# Patient Record
Sex: Female | Born: 1965 | Race: Black or African American | Hispanic: No | Marital: Married
Health system: Southern US, Academic
[De-identification: ages and names within clinical notes are randomized; demographics above are authoritative.]

## PROBLEM LIST (undated history)

## (undated) ENCOUNTER — Ambulatory Visit: Attending: Gastroenterology | Primary: Gastroenterology

## (undated) ENCOUNTER — Ambulatory Visit

## (undated) ENCOUNTER — Telehealth

## (undated) ENCOUNTER — Ambulatory Visit
Attending: Pharmacist Clinician (PhC)/ Clinical Pharmacy Specialist | Primary: Pharmacist Clinician (PhC)/ Clinical Pharmacy Specialist

## (undated) DIAGNOSIS — F4310 Post-traumatic stress disorder, unspecified: Secondary | ICD-10-CM

## (undated) DIAGNOSIS — I252 Old myocardial infarction: Secondary | ICD-10-CM

## (undated) DIAGNOSIS — I10 Essential (primary) hypertension: Secondary | ICD-10-CM

## (undated) DIAGNOSIS — E119 Type 2 diabetes mellitus without complications: Secondary | ICD-10-CM

## (undated) DIAGNOSIS — F32A Depression, unspecified: Secondary | ICD-10-CM

## (undated) HISTORY — PX: OTHER SURGICAL HISTORY: SHX169

## (undated) HISTORY — PX: CARPAL TUNNEL RELEASE: SHX101

---
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
## 2011-06-19 ENCOUNTER — Emergency Department (HOSPITAL_COMMUNITY)
Admission: EM | Admit: 2011-06-19 | Discharge: 2011-06-19 | Disposition: A | Discharge status: Home / Self Care | Payer: Self-pay | Attending: Emergency Medicine | Admitting: Emergency Medicine

## 2011-06-19 DIAGNOSIS — F329 Major depressive disorder, single episode, unspecified: Secondary | ICD-10-CM | POA: Insufficient documentation

## 2011-06-19 DIAGNOSIS — R05 Cough: Secondary | ICD-10-CM | POA: Insufficient documentation

## 2011-06-19 DIAGNOSIS — J398 Other specified diseases of upper respiratory tract: Secondary | ICD-10-CM | POA: Insufficient documentation

## 2011-06-19 DIAGNOSIS — J069 Acute upper respiratory infection, unspecified: Secondary | ICD-10-CM | POA: Insufficient documentation

## 2011-06-19 DIAGNOSIS — F3289 Other specified depressive episodes: Secondary | ICD-10-CM | POA: Insufficient documentation

## 2011-06-19 DIAGNOSIS — J40 Bronchitis, not specified as acute or chronic: Secondary | ICD-10-CM | POA: Insufficient documentation

## 2011-06-19 DIAGNOSIS — E669 Obesity, unspecified: Secondary | ICD-10-CM | POA: Insufficient documentation

## 2011-06-19 DIAGNOSIS — R0982 Postnasal drip: Secondary | ICD-10-CM | POA: Insufficient documentation

## 2011-06-19 DIAGNOSIS — R059 Cough, unspecified: Secondary | ICD-10-CM | POA: Insufficient documentation

## 2011-12-01 ENCOUNTER — Other Ambulatory Visit: Payer: Self-pay

## 2011-12-01 ENCOUNTER — Emergency Department (HOSPITAL_COMMUNITY): Payer: Self-pay

## 2011-12-01 ENCOUNTER — Emergency Department (HOSPITAL_COMMUNITY)
Admission: EM | Admit: 2011-12-01 | Discharge: 2011-12-01 | Disposition: A | Discharge status: Home / Self Care | Payer: Self-pay | Attending: Emergency Medicine | Admitting: Emergency Medicine

## 2011-12-01 ENCOUNTER — Encounter (HOSPITAL_COMMUNITY): Payer: Self-pay

## 2011-12-01 DIAGNOSIS — R05 Cough: Secondary | ICD-10-CM | POA: Insufficient documentation

## 2011-12-01 DIAGNOSIS — R5383 Other fatigue: Secondary | ICD-10-CM | POA: Insufficient documentation

## 2011-12-01 DIAGNOSIS — R0602 Shortness of breath: Secondary | ICD-10-CM | POA: Insufficient documentation

## 2011-12-01 DIAGNOSIS — R059 Cough, unspecified: Secondary | ICD-10-CM | POA: Insufficient documentation

## 2011-12-01 DIAGNOSIS — IMO0001 Reserved for inherently not codable concepts without codable children: Secondary | ICD-10-CM | POA: Insufficient documentation

## 2011-12-01 DIAGNOSIS — R5381 Other malaise: Secondary | ICD-10-CM | POA: Insufficient documentation

## 2011-12-01 DIAGNOSIS — M7918 Myalgia, other site: Secondary | ICD-10-CM

## 2011-12-01 DIAGNOSIS — I10 Essential (primary) hypertension: Secondary | ICD-10-CM | POA: Insufficient documentation

## 2011-12-01 DIAGNOSIS — R079 Chest pain, unspecified: Secondary | ICD-10-CM | POA: Insufficient documentation

## 2011-12-01 HISTORY — DX: Essential (primary) hypertension: I10

## 2011-12-01 HISTORY — DX: Old myocardial infarction: I25.2

## 2011-12-01 LAB — CBC
HCT: 42.8 % (ref 36.0–46.0)
MCV: 89.2 fL (ref 78.0–100.0)
RDW: 13.4 % (ref 11.5–15.5)
WBC: 10.9 10*3/uL — ABNORMAL HIGH (ref 4.0–10.5)

## 2011-12-01 LAB — BASIC METABOLIC PANEL
BUN: 9 mg/dL (ref 6–23)
CO2: 20 mEq/L (ref 19–32)
Chloride: 105 mEq/L (ref 96–112)
Creatinine, Ser: 0.53 mg/dL (ref 0.50–1.10)
GFR calc Af Amer: >90 mL/min (ref >90)

## 2011-12-01 MED ORDER — IBUPROFEN 200 MG PO TABS
600.0000 mg | ORAL_TABLET | Freq: Once | ORAL | Status: AC
  Administered 2011-12-01: 600 mg via ORAL
  Filled 2011-12-01: qty 3

## 2011-12-01 MED ORDER — ONDANSETRON HCL 4 MG/2ML IJ SOLN
4.0000 mg | Freq: Once | INTRAMUSCULAR | Status: AC
  Administered 2011-12-01: 4 mg via INTRAVENOUS
  Filled 2011-12-01: qty 2

## 2011-12-01 MED ORDER — FENTANYL CITRATE 0.05 MG/ML IJ SOLN
50.0000 ug | Freq: Once | INTRAMUSCULAR | Status: AC
  Administered 2011-12-01: 50 ug via INTRAVENOUS
  Filled 2011-12-01: qty 2

## 2011-12-01 NOTE — ED Notes (Signed)
Pt to xray

## 2011-12-01 NOTE — ED Notes (Signed)
Waiting for Md to reassess, lab results.
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
## 2011-12-01 NOTE — Discharge Instructions (Signed)
[name] the labs and x-ray today show that you are not having a heart attack today in the ER.  The pain is musculoskeletal in nature.  Use ibuprofen 600mg  every 6 hours x 24 with food and an ice pack to the area.  Get a pcp from the list below to follow up with.  Return for severe SOB, chest pain, or other concerns.   RESOURCE GUIDE  Dental Problems  Patients with Medicaid: [facility] [phone] [address]                                           [phone] [address] Phone:  [phone]                                                  Phone:  [phone]  If unable to pay or uninsured, contact:  [facility] or [facility]. to become qualified for the adult dental clinic.  Chronic Pain Problems Contact [facility]  [phone] Patients need to be referred by their primary care doctor.  Insufficient Money for Medicine Contact United Way:  call "211" or [facility] 519-024-4054.  No Primary Care Doctor Call [facility]  [phone] Other agencies that provide inexpensive medical care    [facility]  [phone]    [facility]  [phone]    [facility]  [phone]    [facility]  [phone]    [facility]  [phone]    [facility]  [phone]  Psychological [facility]  [phone] [facility]  [phone] [facility]   [phone] (emergency [facility] [phone])  Substance Abuse Resources [facility]  2620512091 [facility] [phone] [facility] [place] [phone] [facility] [phone] Residential & Outpatient Substance Abuse Program  782-407-8483  Abuse/Neglect [facility] Child Abuse Hotline 938-493-6454 [facility] Child Abuse Hotline (856) 560-2652 (After Hours)  Emergency Shelter [facility] (385)483-4495  Maternity Homes Room  at the [place] [facility] [phone] [facility] [phone]) [phone]  MRSA Hotline #:   (972)818-8510    [facility] Resources  [facility]     [employer]                          [facility]. [address] [place]                       [address]      [address] [place] [address]  Anna [place]  [facility] Phone:  [phone]                                   Phone:  [phone]                 Phone:  [phone]  [facility] Mental Health Phone:  [phone]  [facility] Child Abuse Hotline (774) 736-5690 (873)756-8622 (After Hours) Emotional Crisis Part of your problem today may be due to an emotional crisis. Emotional states can cause many different physical signs and symptoms. These may include:  Chest or stomach pain.   Fluttering heartbeat.   Passing out.   Breathing difficulty.   Headaches.   Trembling.   Hot or cold flashes.   Numbness.   Dizziness.   Unusual muscle pain or fatigue.   Insomnia.  When you have other medical problems, they are often made worse by emotional upsets. Emotional crises can increase your stress and anxiety. Finding ways to reduce your stress level can make you feel better. You will become more capable of dealing with these emotional states. Regular physical exercise such as walking can be very beneficial. Counseling or medicine to treat anxiety or depression may also be needed. See your caregiver if you have further problems or questions about your condition. Document Released: 09/02/2005 Document Revised: 08/22/2011 Document Reviewed: 02/17/2007 [facility] Patient Information 2012 [place], [place].Emotional Crisis Part of your problem today may be due to an emotional crisis. Emotional states can cause many different physical signs and symptoms. These may include:  Chest or stomach pain.   Fluttering  heartbeat.   Passing out.   Breathing difficulty.   Headaches.   Trembling.   Hot or cold flashes.   Numbness.   Dizziness.   Unusual muscle pain or fatigue.   Insomnia.  When you have other medical problems, they are often made worse by emotional upsets. Emotional crises can increase your stress and anxiety. Finding ways to reduce your stress level can make you feel better. You will become more capable of dealing with these emotional states. Regular physical exercise such as walking can be very beneficial. Counseling or medicine to treat anxiety or depression may also be needed. See your caregiver if you have further problems or questions about your condition. Document Released: 09/02/2005 Document Revised: 08/22/2011 Document Reviewed: 02/17/2007 [facility] Patient Information 2012 [place], [place].

## 2011-12-01 NOTE — ED Notes (Signed)
Pt sts she is ready to go home. md made aware.
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
## 2011-12-02 NOTE — ED Provider Notes (Signed)
Medical screening examination/treatment/procedure(s) were performed by non-physician practitioner and as supervising physician I was immediately available for consultation/collaboration.   Joya Gaskins, MD 12/02/11 [phone]

## 2015-08-21 ENCOUNTER — Emergency Department (HOSPITAL_BASED_OUTPATIENT_CLINIC_OR_DEPARTMENT_OTHER)
Admission: EM | Admit: 2015-08-21 | Discharge: 2015-08-21 | Disposition: A | Discharge status: Home / Self Care | Payer: Self-pay | Attending: Emergency Medicine | Admitting: Emergency Medicine

## 2015-08-21 ENCOUNTER — Encounter (HOSPITAL_BASED_OUTPATIENT_CLINIC_OR_DEPARTMENT_OTHER): Payer: Self-pay | Admitting: *Deleted

## 2015-08-21 ENCOUNTER — Emergency Department (HOSPITAL_BASED_OUTPATIENT_CLINIC_OR_DEPARTMENT_OTHER): Payer: Self-pay

## 2015-08-21 DIAGNOSIS — M791 Myalgia: Secondary | ICD-10-CM | POA: Insufficient documentation

## 2015-08-21 DIAGNOSIS — Z79899 Other long term (current) drug therapy: Secondary | ICD-10-CM | POA: Insufficient documentation

## 2015-08-21 DIAGNOSIS — I10 Essential (primary) hypertension: Secondary | ICD-10-CM | POA: Insufficient documentation

## 2015-08-21 DIAGNOSIS — H748X3 Other specified disorders of middle ear and mastoid, bilateral: Secondary | ICD-10-CM | POA: Insufficient documentation

## 2015-08-21 DIAGNOSIS — I252 Old myocardial infarction: Secondary | ICD-10-CM | POA: Insufficient documentation

## 2015-08-21 DIAGNOSIS — J0140 Acute pansinusitis, unspecified: Secondary | ICD-10-CM

## 2015-08-21 DIAGNOSIS — J111 Influenza due to unidentified influenza virus with other respiratory manifestations: Secondary | ICD-10-CM | POA: Insufficient documentation

## 2015-08-21 DIAGNOSIS — F17210 Nicotine dependence, cigarettes, uncomplicated: Secondary | ICD-10-CM | POA: Insufficient documentation

## 2015-08-21 DIAGNOSIS — Z7982 Long term (current) use of aspirin: Secondary | ICD-10-CM | POA: Insufficient documentation

## 2015-08-21 MED ORDER — AZITHROMYCIN 250 MG PO TABS: 250.0000 mg | ORAL_TABLET | Freq: Every day | ORAL | Status: DC

## 2015-08-21 MED ORDER — ALBUTEROL SULFATE HFA 108 (90 BASE) MCG/ACT IN AERS
1.0000 | INHALATION_SPRAY | RESPIRATORY_TRACT | Status: DC | PRN
  Administered 2015-08-21: 1 via RESPIRATORY_TRACT
  Filled 2015-08-21: qty 6.7

## 2015-08-21 NOTE — Discharge Instructions (Signed)
Influenza, Adult Influenza (flu) is an infection in the mouth, nose, and throat (respiratory tract) caused by a virus. The flu can make you feel very ill. Influenza spreads easily from person to person (contagious).  HOME CARE   Only take medicines as told by your doctor.  Use a cool mist humidifier to make breathing easier.  Get plenty of rest until your fever goes away. This usually takes 3 to 4 days.  Drink enough fluids to keep your pee (urine) clear or pale yellow.  Cover your mouth and nose when you cough or sneeze.  Wash your hands well to avoid spreading the flu.  Stay home from work or school until your fever has been gone for at least 1 full day.  Get a flu shot every year. GET HELP RIGHT AWAY IF:   You have trouble breathing or feel short of breath.  Your skin or nails turn blue.  You have severe neck pain or stiffness.  You have a severe headache, facial pain, or earache.  Your fever gets worse or keeps coming back.  You feel sick to your stomach (nauseous), throw up (vomit), or have watery poop (diarrhea).  You have chest pain.  You have a deep cough that gets worse, or you cough up more thick spit (mucus). MAKE SURE YOU:   Understand these instructions.  Will watch your condition.  Will get help right away if you are not doing well or get worse.   This information is not intended to replace advice given to you by your health care provider. Make sure you discuss any questions you have with your health care provider.   Document Released: 06/11/2008 Document Revised: 09/23/2014 Document Reviewed: 12/02/2011 Elsevier Interactive Patient Education 2016 Elsevier Inc.  

## 2015-08-21 NOTE — ED Provider Notes (Addendum)
CSN: [id number]     Arrival date & time 08/21/15  1205 History   First MD Initiated Contact with Patient 08/21/15 1217     Chief Complaint  Patient presents with  . Fever     (Consider location/radiation/quality/duration/timing/severity/associated sxs/prior Treatment) Patient is a 49 y.o. female presenting with URI. The history is provided by the patient.  URI Presenting symptoms: congestion, cough, facial pain, fever, rhinorrhea and sore throat   Severity:  Severe Onset quality:  Gradual Duration:  3 days Timing:  Constant Progression:  Worsening Chronicity:  New Relieved by:  Nothing Worsened by:  Movement (lying down at night) Ineffective treatments:  OTC medications Associated symptoms: headaches, myalgias, sinus pain, sneezing and wheezing   Risk factors: sick contacts   Risk factors: no diabetes mellitus   Risk factors comment:  Smoker   Past Medical History  Diagnosis Date  . MI, old     Pt states "slight one"  . Hypertension    History reviewed. No pertinent past surgical history. No family history on file. Social History  Substance Use Topics  . Smoking status: Current Every Day Smoker -- 0.50 packs/day    Types: Cigarettes  . Smokeless tobacco: None  . Alcohol Use: Yes   OB History    No data available     Review of Systems  Constitutional: Positive for fever.  HENT: Positive for congestion, rhinorrhea, sneezing and sore throat.   Respiratory: Positive for cough and wheezing.   Musculoskeletal: Positive for myalgias.  Neurological: Positive for headaches.  All other systems reviewed and are negative.     Allergies  Review of patient's allergies indicates no known allergies.  Home Medications   Prior to Admission medications   Medication Sig Start Date End Date Taking? Authorizing Provider  aspirin 81 MG chewable tablet Chew 324 mg by mouth once.    Historical Provider, MD  Sertraline HCl (ZOLOFT PO) Take 1 tablet by mouth daily.     Historical Provider, MD   BP 152/108 mmHg  Pulse 92  Temp(Src) 98 F (36.7 C) (Oral)  Resp 20  Ht 5' (1.524 m)  Wt 175 lb (79.379 kg)  BMI 34.18 kg/m2  SpO2 98% Physical Exam  Constitutional: She is oriented to person, place, and time. She appears well-developed and well-nourished. No distress.  HENT:  Head: Normocephalic and atraumatic.  Right Ear: Tympanic membrane is not erythematous. A middle ear effusion is present.  Left Ear: Tympanic membrane is not erythematous. A middle ear effusion is present.  Nose: Mucosal edema and rhinorrhea present. Right sinus exhibits maxillary sinus tenderness and frontal sinus tenderness. Left sinus exhibits maxillary sinus tenderness and frontal sinus tenderness.  Mouth/Throat: Mucous membranes are normal. Posterior oropharyngeal erythema present. No oropharyngeal exudate, posterior oropharyngeal edema or tonsillar abscesses.  Eyes: Conjunctivae and EOM are normal. Pupils are equal, round, and reactive to light.  Neck: Normal range of motion. Neck supple.  Cardiovascular: Normal rate, regular rhythm and intact distal pulses.   No murmur heard. Pulmonary/Chest: Effort normal and breath sounds normal. No respiratory distress. She has no wheezes. She has no rales.  coughing  Abdominal: Soft. She exhibits no distension. There is no tenderness. There is no rebound and no guarding.  Musculoskeletal: Normal range of motion. She exhibits no edema or tenderness.  Neurological: She is alert and oriented to person, place, and time.  Skin: Skin is warm and dry. No rash noted. No erythema.  Psychiatric: She has a normal mood and affect. Her behavior  is normal.  Nursing note and vitals reviewed.   ED Course  Procedures (including critical care time) Labs Review Labs Reviewed - No data to display  Imaging Review Dg Chest 2 View  08/21/2015  CLINICAL DATA:  Cough, shortness of breath for 3 days EXAM: CHEST  2 VIEW COMPARISON:  05/25/2015 FINDINGS: The  heart size and mediastinal contours are within normal limits. Both lungs are clear. The visualized skeletal structures are unremarkable. IMPRESSION: No active cardiopulmonary disease. Electronically Signed   By: Charlett Nose M.D.   On: 08/21/2015 12:52   I have personally reviewed and evaluated these images and lab results as part of my medical decision-making.   EKG Interpretation None      MDM   Final diagnoses:  Acute pansinusitis, recurrence not specified  Influenza    Pt with symptoms concerning for influenza vs acute sinusitis.  Normal exam here and afebrile.  No signs of breathing difficulty  No signs of strep pharyngitis, otitis or abnormal abdominal findings.   CXR  wnl.   Given sinus sx and smoker with cough will give azithro to treat for atypical infeciton however cautioned if this is the flu then won't help.  Will continue antipyretica and rest and fluids and return for any further problems.     Gwyneth Sprout, MD 08/21/15 1259  Gwyneth Sprout, MD 08/21/15 1301

## 2015-08-21 NOTE — ED Notes (Signed)
Fever, sore throat, body aches, headache, cough and sneezing.

## 2017-04-29 ENCOUNTER — Ambulatory Visit
Admission: RE | Admit: 2017-04-29 | Discharge: 2017-04-29 | Disposition: A | Discharge status: Home / Self Care | Attending: Gastroenterology | Admitting: Gastroenterology

## 2017-04-29 DIAGNOSIS — B1920 Unspecified viral hepatitis C without hepatic coma: Principal | ICD-10-CM

## 2017-05-09 ENCOUNTER — Emergency Department
Admission: EM | Admit: 2017-05-09 | Discharge: 2017-05-09 | Disposition: A | Discharge status: Home / Self Care | Source: Intra-hospital

## 2017-05-09 ENCOUNTER — Emergency Department
Admission: EM | Admit: 2017-05-09 | Discharge: 2017-05-09 | Disposition: A | Discharge status: Home / Self Care | Source: Intra-hospital | Attending: Emergency Medicine | Admitting: Emergency Medicine

## 2017-05-09 DIAGNOSIS — R079 Chest pain, unspecified: Principal | ICD-10-CM

## 2017-05-09 DIAGNOSIS — R202 Paresthesia of skin: Secondary | ICD-10-CM

## 2017-05-09 DIAGNOSIS — R200 Anesthesia of skin: Secondary | ICD-10-CM

## 2017-05-09 MED ORDER — BACLOFEN 10 MG TABLET
ORAL_TABLET | Freq: Three times a day (TID) | ORAL | 0 refills | 0.00000 days | Status: CP
Start: 2017-05-09 — End: 2017-05-16

## 2017-05-09 MED ORDER — TRAMADOL 50 MG TABLET
ORAL_TABLET | Freq: Four times a day (QID) | ORAL | 0 refills | 0.00000 days | Status: CP
Start: 2017-05-09 — End: 2017-05-14

## 2017-05-15 ENCOUNTER — Emergency Department
Admission: EM | Admit: 2017-05-15 | Discharge: 2017-05-16 | Disposition: A | Discharge status: Home / Self Care | Source: Intra-hospital | Admitting: Emergency Medicine

## 2017-05-15 ENCOUNTER — Emergency Department
Admission: EM | Admit: 2017-05-15 | Discharge: 2017-05-16 | Disposition: A | Discharge status: Home / Self Care | Source: Intra-hospital

## 2017-05-16 DIAGNOSIS — R1031 Right lower quadrant pain: Principal | ICD-10-CM

## 2017-05-16 MED ORDER — BACLOFEN 10 MG TABLET
ORAL_TABLET | Freq: Three times a day (TID) | ORAL | 0 refills | 0.00000 days | Status: CP
Start: 2017-05-16 — End: 2017-06-15

## 2017-05-16 MED ORDER — MELOXICAM 15 MG TABLET
ORAL_TABLET | Freq: Every day | ORAL | 0 refills | 0.00000 days | Status: CP
Start: 2017-05-16 — End: 2017-06-15

## 2017-08-12 MED ORDER — LEDIPASVIR 90 MG-SOFOSBUVIR 400 MG TABLET: 1 | tablet | Freq: Every day | 2 refills | 0 days | Status: AC

## 2017-08-12 MED ORDER — LEDIPASVIR 90 MG-SOFOSBUVIR 400 MG TABLET
ORAL_TABLET | Freq: Every day | ORAL | 2 refills | 0.00000 days | Status: CP
Start: 2017-08-12 — End: 2017-08-12

## 2017-09-23 NOTE — Unmapped (Signed)
Patient has been approved to receive Harvoni from manufacturer from 09/19/17 to 12/12/17.

## 2017-09-26 NOTE — Unmapped (Addendum)
Initial Counseling for HCV Treatment     Planned regimen: Harvoni (ledipasvir/sofosbuvir 90/400mg ) x 12 weeks  Planned start date: 09/29/16    Pharmacy: [facility] [phone]    Current medications: metformin, lisinopril, ranitdine 150mg  qam, glipizide, cymbalta, trazodone, atorvastatin 20mg .    Patient is ready to start Harvoni. Reports transportation issues. Does not have a car and relies on daughter who works M-F 8-5pm for a ride. Stressed importance of follow up treatment to monitor response.    Following topics were discussed during counseling:   Patient Counseling    Counseled the patient on the following:  doses and administration discussed, possible adverse effects and management discussed, possible drug and prescription drug interactions discussed, possible drug and OTC drug and food interactions discussed, lab monitoring and follow-up discussed, adherence and missed doses discussed, pharmacy contact information discussed        1. Indications for medication, dosage and administration.     A. Harvoni 90/400mg  1 tablet to take daily with or without food. Patient plans to take in the mornings at same time as ranitidine 150mg .      2. Common side effects of medications and management strategies. (fatigue, headache)      3. Importance of adherence to regimen, follow-up clinic visits and lab monitoring.     A. Asked patient to call [facility] [phone] to establish start date for treatment and to schedule appointment 4 weeks before starting treatment.      4. Drug-drug interaction.  Drug Interactions    Drug interactions evaluated:  yes  Clinically relevant drug interactions identified:  yes   Interactions list:  Ranitidine 150mg  qam  Atorvastatin 20mg  daily   Drug management plan:  Ranitidine:  We discussed the mechanism of drug-drug interaction with acid lowering agents such as ranitdine may decrease the absorption of Harvoni.  Instructed that if she must take ranitidine, she may take at same time as Harvoni or 12 hours apart. Pt wishes to take at same time.  Atorvastatin: Advised pt of potential increased levels of atorvastatin if taken with Harvoni. Advised pt to monitor for potential increased side effects from statin (i.e. Muscle ache, dark urine etc) and to notify PCP of these adverse events.             A. Current medications have been reviewed and assessed for possible interaction.  We discussed the mechanism of drug-drug interaction with acid lowering agents. Advised to check with MD or pharmacist before taking any OTC/herbal medications, with emphasis regarding indigestion/heartburn medications.  Denies use of herbal medication such as milk thistle or St. John's wart.  Allergies have been verified.      5. Importance of informing pharmacy and clinic of updated contact information. Stressed importance of being able to reach over the phone to set up refills. Advised patient to call pharmacy when down to about 7 day supply left to ensure there's no interruption in therapy.    Patient verbalized understanding. Provided contact information for any questions/concerns.       Park Breed, Pharm D., BCPS, BCGP, CPP  [facility]  [address]  [place], [place] [address]  [phone]    September 25, 2017 4:23 PM

## 2017-10-09 NOTE — Unmapped (Signed)
Patient called to report that she started taking her medication 09-29-17. She has an appointment in the clinic on 10-28-17 and can get labs done then.  She is having headaches. Instructed her on the safe use of acetaminophen and 2 gm limit.

## 2017-10-14 NOTE — Unmapped (Addendum)
Follow-Up Counseling for HCV Treatment    Regimen: Harvoni (ledipasvir/sofosbuvir 90/400mg ) x 12 weeks  Start Date: 09/29/17  Completed Treatment Week #2    Pharmacy: [facility] [phone]      Following topics were reviewed during the phone call:      1. Medication administration - Takes Harvoni every morning between 0630 - 0700 A.M when her husband leaves for work.     2. Importance of adherence -   Medication Adherence    Patient Reported X Missed Doses in the Last Month:  0  Specialty Medication:  Harvoni  Patient is on additional specialty medications:  No  Patient is on more than two specialty medications:  No  Gaps in Refill History Greater than 2 Weeks in the Last 3 Months:  No  Demonstrates Understanding of Importance of Adherence:  Yes  Informant:  patient  Reliability of Informant:  reliable  Provider-Estimated Medication Adherence Level:  90-100%  Patient is at risk for Non-Adherence:  No   Reasons for Non-Adherence:  no problems identified  Scientist, clinical (histocompatibility and immunogenetics) for Adherence:  family member  Confirmed Plan for Next Specialty Medication Refill:  outside pharmacy  Medication Assistance Program  Refill Coordination  Shipping Information        Pill count over the phone revealed #12 tablets which is appropriate.    3. Side effects - Patient reports having bad headaches which have become migraines. Patient is using Tylenol up to 2,000 mg per day as needed. Patient reports drinking 1/2 gallon of water daily.   Adverse Effects    Neck pain:  Pos  Headaches:  Pos         4. Drug-drug interaction - Patient states she is not taking Ranitidine. Patient reports some muscle aches in her neck. Patient states her urine is very clear Patient encouraged to follow up with PCP if her muscle aches become worse.   Drug Interactions    Drug interactions evaluated:  yes  Clinically relevant drug interactions identified:  yes   Interactions list:  Ranitidine 150mg  qam  Atorvastatin 20mg  daily   Drug management plan: Ranitidine:  We discussed the mechanism of drug-drug interaction with acid lowering agents such as ranitdine may decrease the absorption of Harvoni.  Instructed that if she must take ranitidine, she may take at same time as Harvoni or 12 hours apart. Pt wishes to take at same time.  Atorvastatin: Advised pt of potential increased levels of atorvastatin if taken with Harvoni. Advised pt to monitor for potential increased side effects from statin (i.e. Muscle ache, dark urine etc) and to notify PCP of these adverse events.               5. Follow up - Has follow up appointment scheduled in HCV treatment clinic on 10/28/17 @ 0900. Patient states she will have a ride for the appointment.  Advised patient to call pharmacy when down to about 7 day supply left to ensure there's no interruption in therapy.  Patient states [facility] has already called and set up her next delivery.     All questions were answered.      Vertell Limber RN, [facility]   Pharmacy Adult GI Medicine  [facility]  [address]   [place], [place] [address]  [phone]    October 14, 2017 11:03 AM

## 2017-10-24 NOTE — Unmapped (Signed)
Patient called to say that she is having headaches and bone and joint pain from the treatment medication and she doesn't think that she can finish the med.  She has been to the local ED twice for headache pain.    After speaking with the patient for a while, she is taking acetaminophen for her headaches and that helps a lot. Also, the bone pain is just in her neck. It starts right away after she takes the pill but lessens as the time passes.  Reviewed her medication plan. She is to be treated for 8 weeks. She has already received her second bottle of pills.  Patient reassured that she can continue her treatment - relieved to know that she will only be taking this med for 8 weeks.

## 2017-11-10 NOTE — Unmapped (Signed)
Patient called to verify the length of HCV treatment.   Harvoni ordered for 12 weeks. Start date 09/29/17. Will continue until 12-22-17.  Patient confirmed that she had labs done at [facility]. Will look for results and notify patient of results.

## 2017-12-09 NOTE — Unmapped (Signed)
Follow-Up Counseling for HCV Treatment    Regimen: Harvoni (ledipasvir/sofosbuvir 90/400mg ) x 12 weeks  Start Date: 09/29/17  Completed Treatment Week #10    Pharmacy: [facility] [phone]      Following topics were reviewed during the phone call:  Patient Counseling    Counseled the patient on the following:  doses and administration discussed, possible adverse effects and management discussed, possible drug and prescription drug interactions discussed, possible drug and OTC drug and food interactions discussed, lab monitoring and follow-up discussed, therapeutic rationale discussed, adherence and missed doses discussed, pharmacy contact information discussed         1. Medication administration - Takes Harvoni every morning between 0630 & 0700 AM when her husband leaves for work.     2. Importance of adherence -   Medication Adherence    Patient reported X missed doses in the last month:  0  Specialty Medication:  Harvoni  Patient is on additional specialty medications:  No  Patient is on more than two specialty medications:  No  Any gaps in refill history greater than 2 weeks in the last 3 months:  no  Demonstrates understanding of importance of adherence:  yes  Informant:  patient  Reliability of informant:  reliable  Provider-estimated medication adherence level:  90-100%  Patient is at risk for Non-Adherence:  No  Reasons for non-adherence:  no problems identified  Adherence tools used:  directed education  Support network for adherence:  family member  Confirmed plan for next specialty medication refill:  outside pharmacy        Pill count over the phone revealed #12 tablets which is appropriate.    3. Side effects - Patient reports dull headache pretty continuous through treatment. Patient reports using 500mg  Extra Strength Tylenol. Patient only takes one daily. Patient advised she is able to take up to 2,000 mg daily as needed. Patient also advised to drink plenty of water.   Adverse Effects Headaches:  Pos         4. Drug-drug interaction - Patient denies starting any new medications. Patient denies any dark urine, muscle aches or weakness. Patient instructed to notify PCP of any of these adverse events. Patient denies taking any Ranitidine.   Drug Interactions    Drug interactions evaluated:  yes  Clinically relevant drug interactions identified:  yes   Interactions list:  Ranitidine 150mg  qam  Atorvastatin 20mg  daily   Drug management plan:  Ranitidine:  We discussed the mechanism of drug-drug interaction with acid lowering agents such as ranitdine may decrease the absorption of Harvoni.  Instructed that if she must take ranitidine, she may take at same time as Harvoni or 12 hours apart. Pt wishes to take at same time.  Atorvastatin: Advised pt of potential increased levels of atorvastatin if taken with Harvoni. Advised pt to monitor for potential increased side effects from statin (i.e. Muscle ache, dark urine etc) and to notify PCP of these adverse events.               5. Follow up - Has follow up appointment scheduled in HCV treatment clinic on 02/25/18 @ 0920 with Tina Griffiths, PA. Informed patient that there is still a small chance of relapse after finishing the treatment. Stressed importance of follow up 3 months post treatment to assess for cure.      All questions were answered.      Vertell Limber RN, BSN  Nursing Care Coordinator   Pharmacy Adult GI Medicine  [facility]  [address]   [place], [place] [address]  [phone]    December 09, 2017 9:17 AM

## 2017-12-17 NOTE — Unmapped (Signed)
Patient called for recent lab results. Testing was done at [facility] and are not on her chart.  Patient has 6 tablets of Harvoni left.  New order for EOT and SVR labs faxed to [facility] along with a request for results to be faxed to this office.

## 2018-10-06 ENCOUNTER — Encounter (HOSPITAL_BASED_OUTPATIENT_CLINIC_OR_DEPARTMENT_OTHER): Payer: Self-pay | Admitting: Emergency Medicine

## 2018-10-06 ENCOUNTER — Emergency Department (HOSPITAL_BASED_OUTPATIENT_CLINIC_OR_DEPARTMENT_OTHER)
Admission: EM | Admit: 2018-10-06 | Discharge: 2018-10-06 | Disposition: A | Discharge status: Home / Self Care | Payer: BLUE CROSS/BLUE SHIELD | Attending: Emergency Medicine | Admitting: Emergency Medicine

## 2018-10-06 ENCOUNTER — Emergency Department (HOSPITAL_BASED_OUTPATIENT_CLINIC_OR_DEPARTMENT_OTHER): Payer: BLUE CROSS/BLUE SHIELD

## 2018-10-06 ENCOUNTER — Other Ambulatory Visit: Payer: Self-pay

## 2018-10-06 DIAGNOSIS — F17210 Nicotine dependence, cigarettes, uncomplicated: Secondary | ICD-10-CM | POA: Insufficient documentation

## 2018-10-06 DIAGNOSIS — J069 Acute upper respiratory infection, unspecified: Secondary | ICD-10-CM | POA: Insufficient documentation

## 2018-10-06 DIAGNOSIS — R509 Fever, unspecified: Secondary | ICD-10-CM | POA: Diagnosis present

## 2018-10-06 DIAGNOSIS — Z79899 Other long term (current) drug therapy: Secondary | ICD-10-CM | POA: Diagnosis not present

## 2018-10-06 MED ORDER — PREDNISONE 20 MG PO TABS: 60.0000 mg | ORAL_TABLET | Freq: Every day | ORAL | 0 refills | Status: AC

## 2018-10-06 MED ORDER — ALBUTEROL SULFATE HFA 108 (90 BASE) MCG/ACT IN AERS
4.0000 | INHALATION_SPRAY | Freq: Once | RESPIRATORY_TRACT | Status: AC
  Administered 2018-10-06: 4 via RESPIRATORY_TRACT
  Filled 2018-10-06: qty 6.7

## 2018-10-06 MED ORDER — PREDNISONE 50 MG PO TABS
60.0000 mg | ORAL_TABLET | Freq: Once | ORAL | Status: AC
  Administered 2018-10-06: 60 mg via ORAL
  Filled 2018-10-06: qty 1

## 2018-10-06 NOTE — ED Triage Notes (Signed)
Reports fever with vomiting and generalized body aches since yesterday.

## 2018-10-06 NOTE — ED Notes (Addendum)
Pt c/o cough and fever with generalized body ache x 2 days. Pt also c/o fever, N/V/D. Pt states dep breaths cause her to cough. Pt denies taking any medications today, has taken nyquil

## 2018-10-06 NOTE — Discharge Instructions (Addendum)
No pneumonia found on chest x-ray.  Continue albuterol at home as well as daily steroid for the next 4 days.

## 2018-10-06 NOTE — ED Provider Notes (Signed)
[facility] EMERGENCY DEPARTMENT Provider Note   CSN: [id number] Arrival date & time: 10/06/18  1437     History   Chief Complaint Chief Complaint  Patient presents with  . Fever    HPI Anna Reynolds is a 53 y.o. female.  The history is provided by the patient.  Cough  Cough characteristics:  Non-productive Sputum characteristics:  Nondescript Severity:  Mild Onset quality:  Gradual Timing:  Intermittent Progression:  Waxing and waning Chronicity:  New Smoker: yes   Context: upper respiratory infection   Relieved by:  Beta-agonist inhaler Worsened by:  Nothing Associated symptoms: chills, fever, myalgias, sinus congestion and wheezing   Associated symptoms: no chest pain, no ear pain, no rash, no shortness of breath and no sore throat     Past Medical History:  Diagnosis Date  . Hypertension   . MI, old    Pt states "slight one"    There are no active problems to display for this patient.   History reviewed. No pertinent surgical history.   OB History   No obstetric history on file.      Home Medications    Prior to Admission medications   Medication Sig Start Date End Date Taking? Authorizing Provider  aspirin 81 MG chewable tablet Chew 324 mg by mouth once.    [provider]  azithromycin (ZITHROMAX) 250 MG tablet Take 1 tablet (250 mg total) by mouth daily. Take first 2 tablets together, then 1 every day until finished. 08/21/15   Gwyneth SproutPlunkett, Whitney, MD  predniSONE (DELTASONE) 20 MG tablet Take 3 tablets (60 mg total) by mouth daily for 4 days. 10/06/18 10/10/18  Asar Evilsizer, DO  Sertraline HCl (ZOLOFT PO) Take 1 tablet by mouth daily.    [provider]    Family History History reviewed. No pertinent family history.  Social History Social History   Tobacco Use  . Smoking status: Current Every Day Smoker    Packs/day: 0.50    Types: Cigarettes  Substance Use Topics  . Alcohol use: Yes  . Drug use: No      Allergies   Patient has no known allergies.   Review of Systems Review of Systems  Constitutional: Positive for chills and fever.  HENT: Negative for ear pain and sore throat.   Eyes: Negative for pain and visual disturbance.  Respiratory: Positive for cough and wheezing. Negative for shortness of breath.   Cardiovascular: Negative for chest pain and palpitations.  Gastrointestinal: Negative for abdominal pain and vomiting.  Genitourinary: Negative for dysuria and hematuria.  Musculoskeletal: Positive for myalgias. Negative for arthralgias and back pain.  Skin: Negative for color change and rash.  Neurological: Negative for seizures and syncope.  All other systems reviewed and are negative.    Physical Exam Updated Vital Signs BP (!) 161/93 (BP Location: Right Arm)   Pulse 100   Temp 98.5 F (36.9 C) (Oral)   Resp 18   Ht 5' (1.524 m)   Wt 81.6 kg   SpO2 99%   BMI 35.15 kg/m   Physical Exam Vitals signs and nursing note reviewed.  Constitutional:      General: She is not in acute distress.    Appearance: She is well-developed.  HENT:     Head: Normocephalic and atraumatic.     Right Ear: Tympanic membrane normal.     Left Ear: Tympanic membrane normal.     Nose: Congestion present.     Mouth/Throat:     Mouth:  Mucous membranes are moist.     Pharynx: No oropharyngeal exudate or posterior oropharyngeal erythema.  Eyes:     Extraocular Movements: Extraocular movements intact.     Conjunctiva/sclera: Conjunctivae normal.     Pupils: Pupils are equal, round, and reactive to light.  Neck:     Musculoskeletal: Normal range of motion and neck supple.  Cardiovascular:     Rate and Rhythm: Normal rate and regular rhythm.     Pulses: Normal pulses.     Heart sounds: Normal heart sounds. No murmur.  Pulmonary:     Effort: Pulmonary effort is normal. No respiratory distress.     Breath sounds: Wheezing present.  Abdominal:     Palpations: Abdomen is soft.      Tenderness: There is no abdominal tenderness.  Musculoskeletal:     Right lower leg: No edema.     Left lower leg: No edema.  Skin:    General: Skin is warm and dry.     Capillary Refill: Capillary refill takes less than 2 seconds.  Neurological:     General: No focal deficit present.     Mental Status: She is alert.      ED Treatments / Results  Labs (all labs ordered are listed, but only abnormal results are displayed) Labs Reviewed - No data to display  EKG None  Radiology Dg Chest 2 View  Result Date: 10/06/2018 CLINICAL DATA:  Cough, body aches, vomiting, ha since yesterday. EXAM: CHEST - 2 VIEW COMPARISON:  06/04/2018 FINDINGS: Coarse bronchovascular markings without focal infiltrate or overt edema. Heart size upper limits normal.  No effusion. No pneumothorax. Visualized bones unremarkable. IMPRESSION: No acute cardiopulmonary disease. Electronically Signed   By: Corlis Leak M.D.   On: 10/06/2018 15:21    Procedures Procedures (including critical care time)  Medications Ordered in ED Medications  albuterol (PROVENTIL HFA;VENTOLIN HFA) 108 (90 Base) MCG/ACT inhaler 4 puff (4 puffs Inhalation Given 10/06/18 1516)  predniSONE (DELTASONE) tablet 60 mg (60 mg Oral Given 10/06/18 1516)     Initial Impression / Assessment and Plan / ED Course  I have reviewed the triage vital signs and the nursing notes.  Pertinent labs & imaging results that were available during my care of the patient were reviewed by me and considered in my medical decision making (see chart for details).     Anna Reynolds is a 54 year old female who presents to the ED with URI symptoms.  Patient with normal vitals.  No fever.  Patient with cough, wheezing for the last several days.  History of bronchitis.  Current smoker.  Uses inhalers at home.  No signs of ear or throat infection on exam.  Mild wheezing throughout but no signs of respiratory distress.  Patient with likely viral process.  Chest  x-ray showed no pneumonia, pneumothorax, pleural effusion.  Patient felt better after albuterol and prednisone.  Given prescription for prednisone.  Recommend continued use of Tylenol, Motrin, hydration at home.  Recommend follow-up with primary care doctor if symptoms not improve. Like reactive airway disease secondary to URI. Discharged from ED in good condition and given return precautions.  This chart was dictated using voice recognition software.  Despite best efforts to proofread,  errors can occur which can change the documentation meaning.   Final Clinical Impressions(s) / ED Diagnoses   Final diagnoses:  Upper respiratory tract infection, unspecified type    ED Discharge Orders         Ordered    predniSONE (  DELTASONE) 20 MG tablet  Daily     10/06/18 1537           [place], Latysha Thackston, DO 10/06/18 1537

## 2021-06-12 ENCOUNTER — Emergency Department (HOSPITAL_BASED_OUTPATIENT_CLINIC_OR_DEPARTMENT_OTHER): Payer: 59

## 2021-06-12 ENCOUNTER — Emergency Department (HOSPITAL_BASED_OUTPATIENT_CLINIC_OR_DEPARTMENT_OTHER): Payer: 59 | Admitting: Radiology

## 2021-06-12 ENCOUNTER — Encounter (HOSPITAL_BASED_OUTPATIENT_CLINIC_OR_DEPARTMENT_OTHER): Payer: Self-pay | Admitting: *Deleted

## 2021-06-12 ENCOUNTER — Emergency Department (HOSPITAL_BASED_OUTPATIENT_CLINIC_OR_DEPARTMENT_OTHER)
Admission: EM | Admit: 2021-06-12 | Discharge: 2021-06-12 | Disposition: A | Discharge status: Home / Self Care | Payer: 59 | Attending: Emergency Medicine | Admitting: Emergency Medicine

## 2021-06-12 ENCOUNTER — Other Ambulatory Visit: Payer: Self-pay

## 2021-06-12 DIAGNOSIS — Z79899 Other long term (current) drug therapy: Secondary | ICD-10-CM | POA: Insufficient documentation

## 2021-06-12 DIAGNOSIS — I10 Essential (primary) hypertension: Secondary | ICD-10-CM | POA: Insufficient documentation

## 2021-06-12 DIAGNOSIS — Z7984 Long term (current) use of oral hypoglycemic drugs: Secondary | ICD-10-CM | POA: Insufficient documentation

## 2021-06-12 DIAGNOSIS — Y92410 Unspecified street and highway as the place of occurrence of the external cause: Secondary | ICD-10-CM | POA: Insufficient documentation

## 2021-06-12 DIAGNOSIS — M545 Low back pain, unspecified: Secondary | ICD-10-CM | POA: Insufficient documentation

## 2021-06-12 DIAGNOSIS — E119 Type 2 diabetes mellitus without complications: Secondary | ICD-10-CM | POA: Insufficient documentation

## 2021-06-12 DIAGNOSIS — M546 Pain in thoracic spine: Secondary | ICD-10-CM | POA: Diagnosis not present

## 2021-06-12 DIAGNOSIS — Z794 Long term (current) use of insulin: Secondary | ICD-10-CM | POA: Diagnosis not present

## 2021-06-12 DIAGNOSIS — F17210 Nicotine dependence, cigarettes, uncomplicated: Secondary | ICD-10-CM | POA: Diagnosis not present

## 2021-06-12 DIAGNOSIS — M549 Dorsalgia, unspecified: Secondary | ICD-10-CM | POA: Diagnosis present

## 2021-06-12 DIAGNOSIS — M7918 Myalgia, other site: Secondary | ICD-10-CM

## 2021-06-12 DIAGNOSIS — M542 Cervicalgia: Secondary | ICD-10-CM | POA: Insufficient documentation

## 2021-06-12 HISTORY — DX: Type 2 diabetes mellitus without complications: E11.9

## 2021-06-12 HISTORY — DX: Depression, unspecified: F32.A

## 2021-06-12 HISTORY — DX: Post-traumatic stress disorder, unspecified: F43.10

## 2021-06-12 MED ORDER — METHOCARBAMOL 500 MG PO TABS: 500.0000 mg | ORAL_TABLET | Freq: Two times a day (BID) | ORAL | 0 refills | Status: AC

## 2021-06-12 MED ORDER — NAPROXEN 500 MG PO TABS: 500.0000 mg | ORAL_TABLET | Freq: Two times a day (BID) | ORAL | 0 refills | Status: AC

## 2021-06-12 MED ORDER — IBUPROFEN 400 MG PO TABS
600.0000 mg | ORAL_TABLET | Freq: Once | ORAL | Status: AC
  Administered 2021-06-12: 600 mg via ORAL
  Filled 2021-06-12: qty 1
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
## 2021-06-12 NOTE — Discharge Instructions (Addendum)
Please pick up medications and take as needed for pain. DO NOT DRIVE OR DRINK ALCOHOL WHILE ON THE MUSCLE RELAXER AS IT CAN MAKE YOU DROWSY.   I would recommend taking the muscle relaxer at nighttime to help you sleep and to take the anti inflammatory during the day time.   Follow up with your PCP regarding ED visit today  Return to the ED for any new/worsening symptoms

## 2023-03-10 IMAGING — CT CT T SPINE W/O CM
2 of 3 series · 11 of 33 positions shown, 13 images · non-contrast
Comparison: None.

CLINICAL DATA: Low back pain, trauma

EXAM:
CT THORACIC AND LUMBAR SPINE WITHOUT CONTRAST
TECHNIQUE: Multidetector CT imaging of the thoracic and lumbar spine was
performed without contrast. Multiplanar CT image reconstructions
were also generated.

[Series 5: t spine soft · axial · 0.44mm/px · z∈[-420,-168]mm · 8 of 150 slices shown, 10 images]
[im 12/150  soft-tissue]
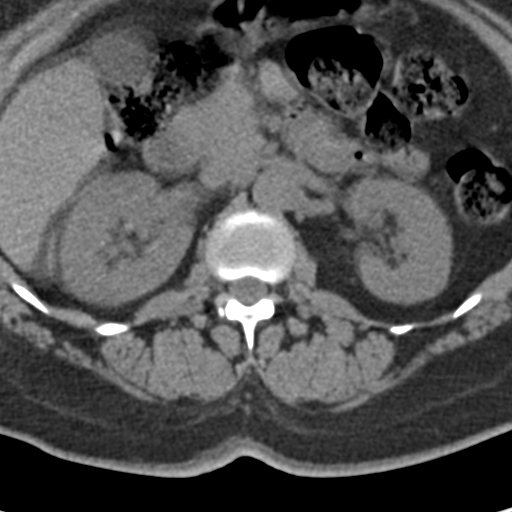
[im 12/150  bone]
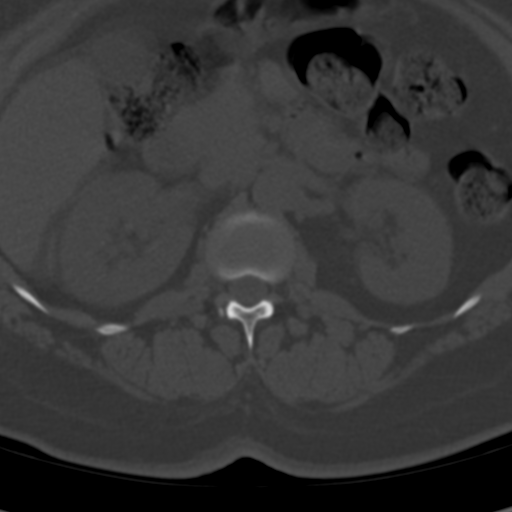
[im 35/150  bone]
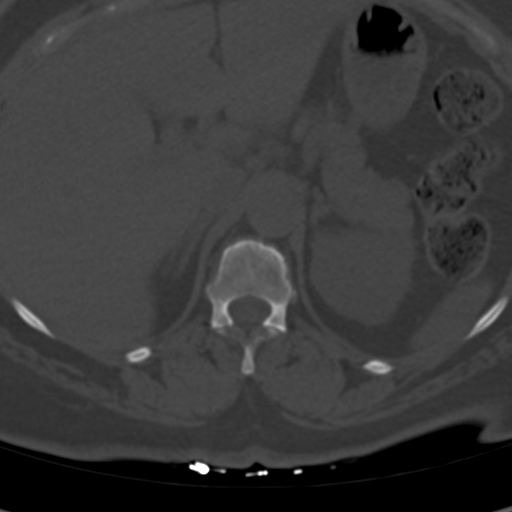
[im 46/150  bone]
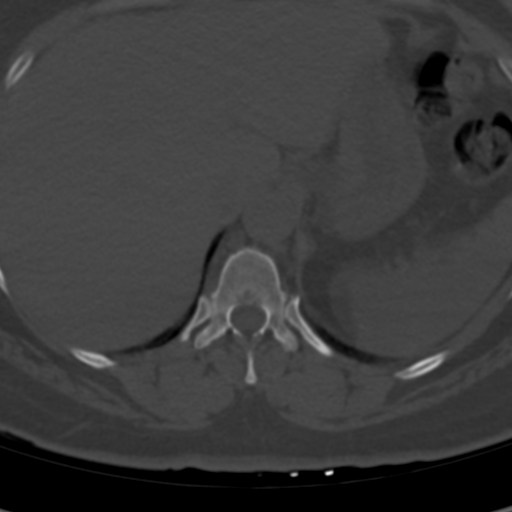
[im 69/150  bone]
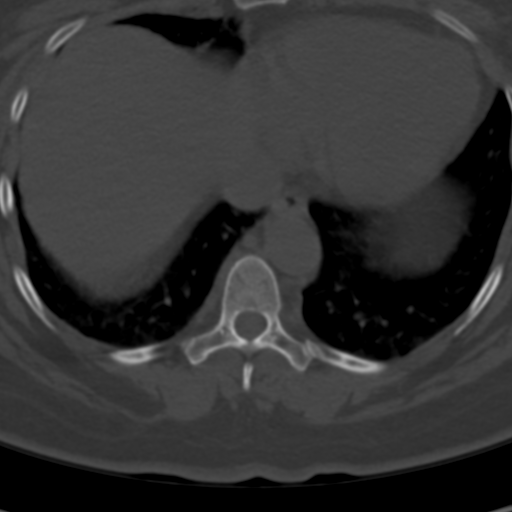
[im 81/150  soft-tissue]
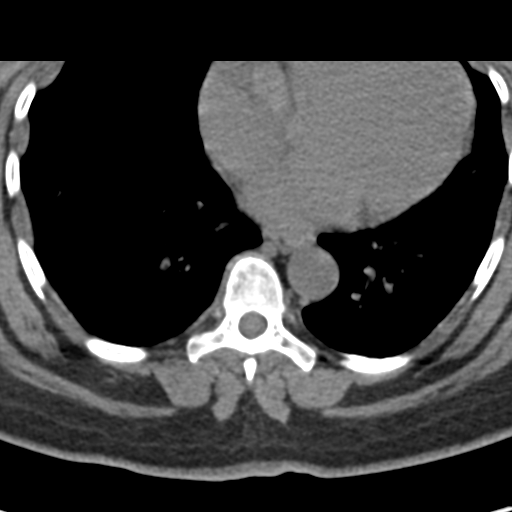
[im 81/150  bone]
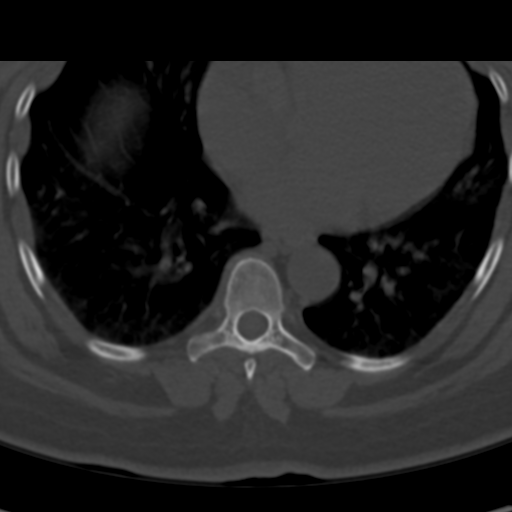
[im 104/150  bone]
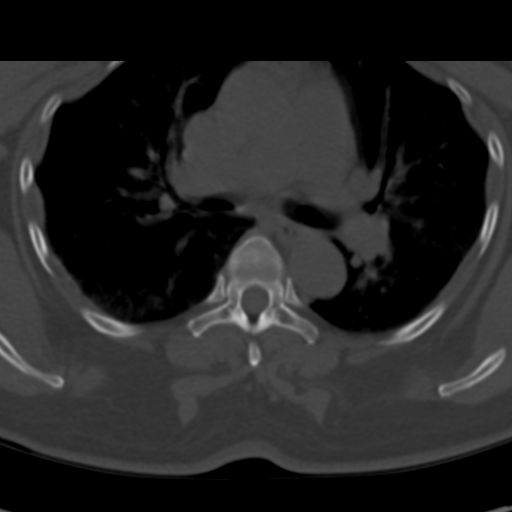
[im 115/150  bone]
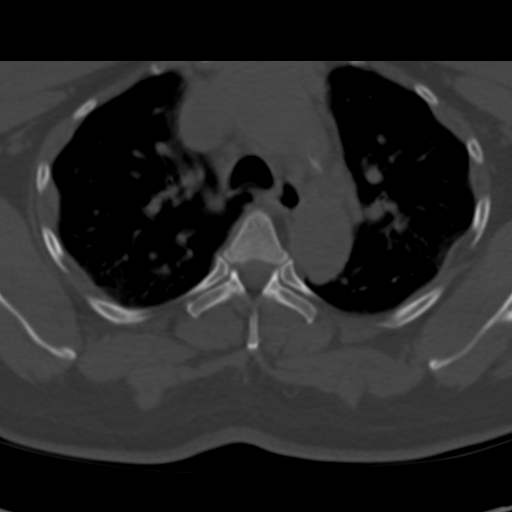
[im 138/150  bone]
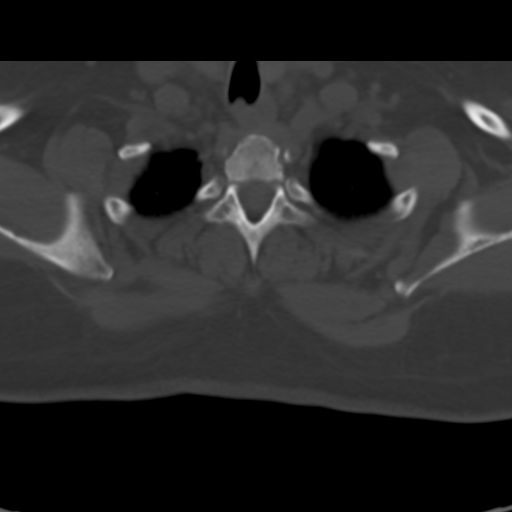

[Series 7: cor bone · coronal · 0.23mm/px · 3 of 81 slices shown]
[im 17/81  bone]
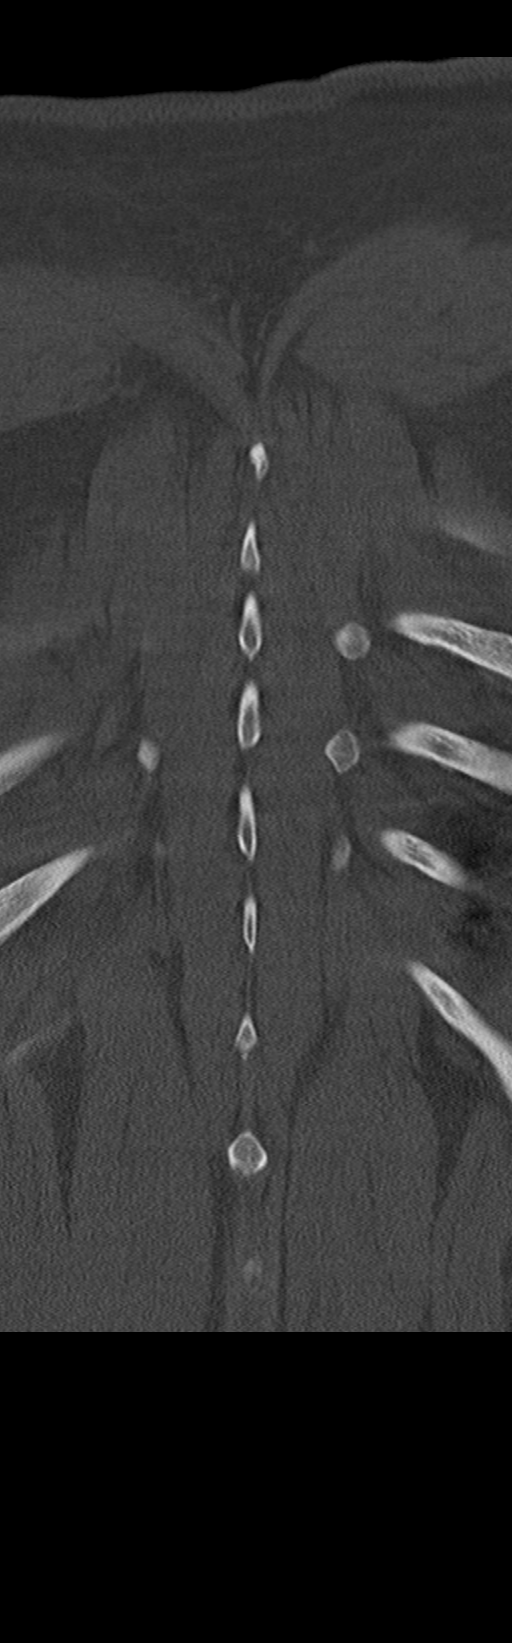
[im 33/81  bone]
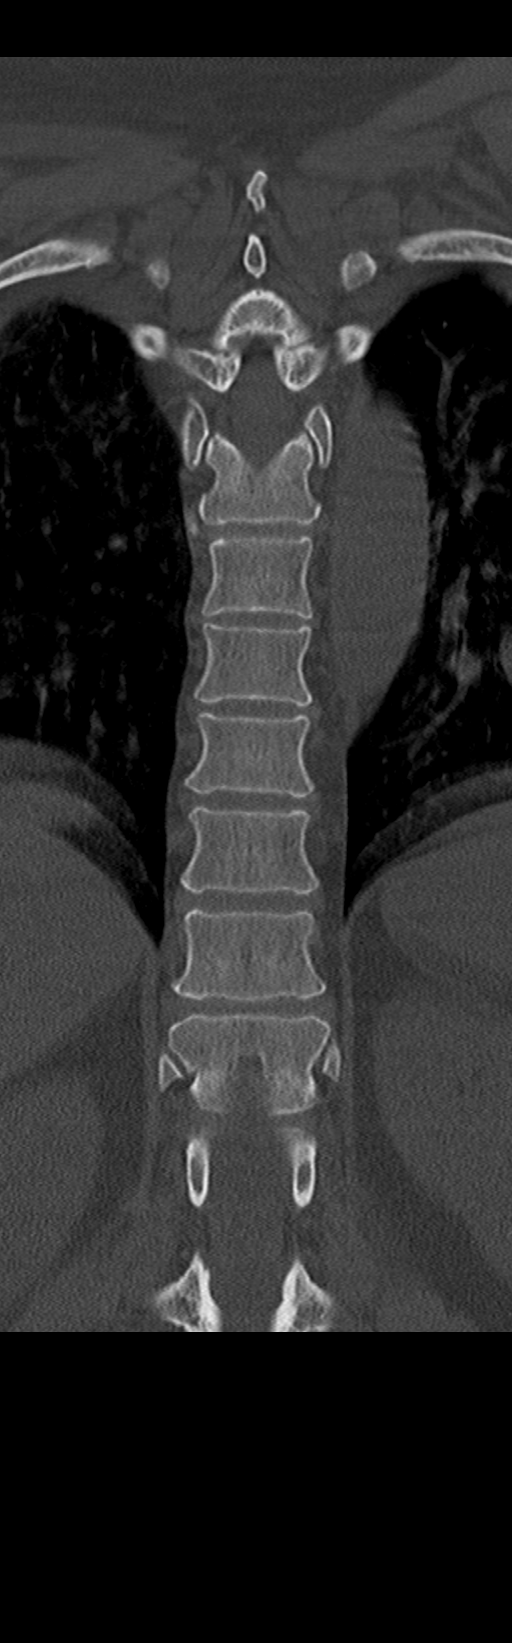
[im 49/81  bone]
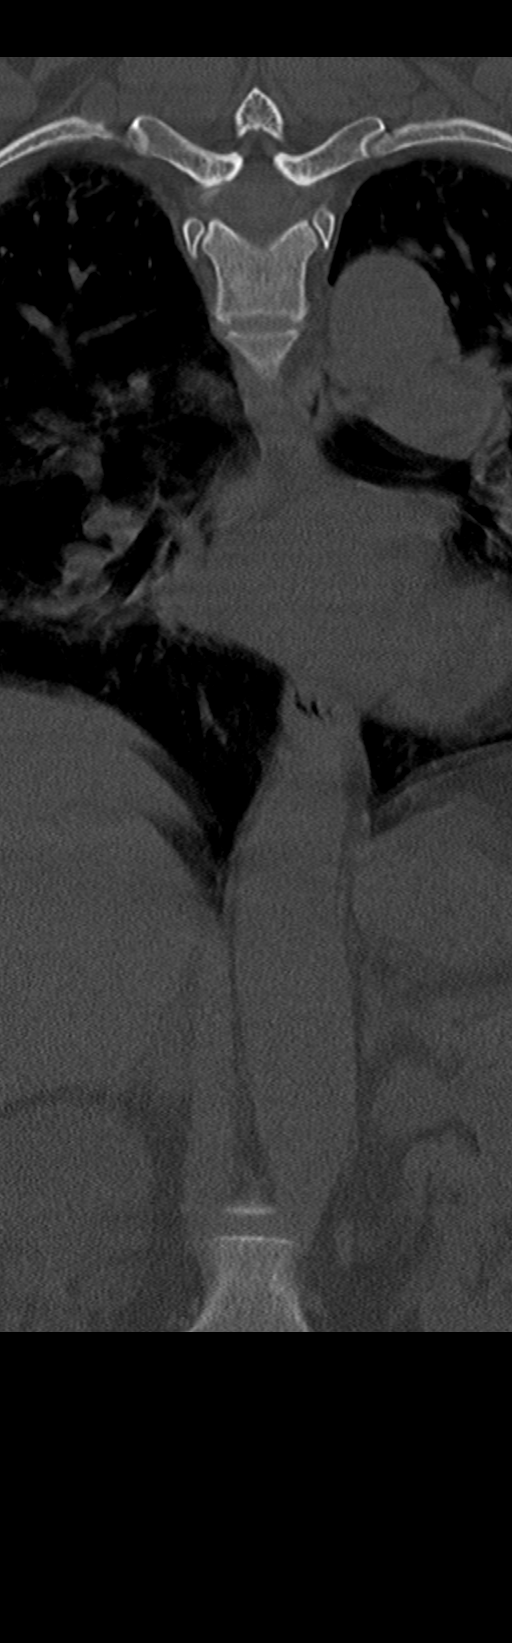

[11 of 33 positions shown; findings below may reference images not displayed]

FINDINGS: CT THORACIC SPINE FINDINGS

Alignment: Preserved.

Vertebrae: Vertebral body heights are maintained. No acute fracture.

Paraspinal and other soft tissues: No acute abnormality.

Disc levels: Intervertebral disc heights are maintained.

CT LUMBAR SPINE FINDINGS

Segmentation: 5 lumbar type vertebrae.

Alignment: Preserved.

Vertebrae: Vertebral body heights are maintained. No acute fracture.

Paraspinal and other soft tissues: No acute abnormality.

Disc levels: Intervertebral disc heights are maintained.
IMPRESSION: No acute fracture of the thoracolumbar spine.

## 2023-03-10 IMAGING — CT CT L SPINE W/O CM
3 series · 11 of 33 positions shown, 13 images · non-contrast
Comparison: None.

CLINICAL DATA: Low back pain, trauma

EXAM:
CT THORACIC AND LUMBAR SPINE WITHOUT CONTRAST
TECHNIQUE: Multidetector CT imaging of the thoracic and lumbar spine was
performed without contrast. Multiplanar CT image reconstructions
were also generated.

[Series 5: l-spine wo soft tissue · axial · 0.40mm/px · z∈[-560,-408]mm · 3 of 124 slices shown, 4 images]
[im 29/124  soft-tissue]
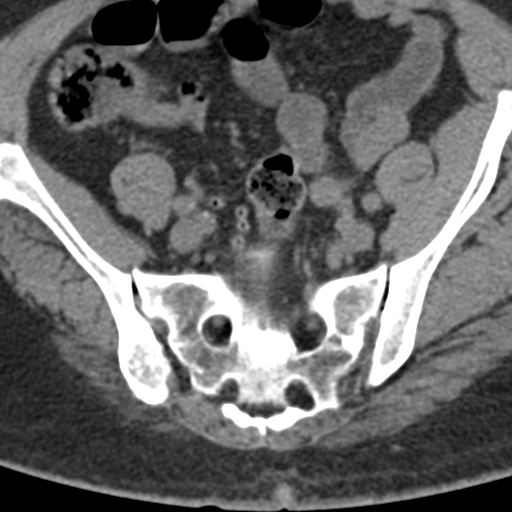
[im 29/124  bone]
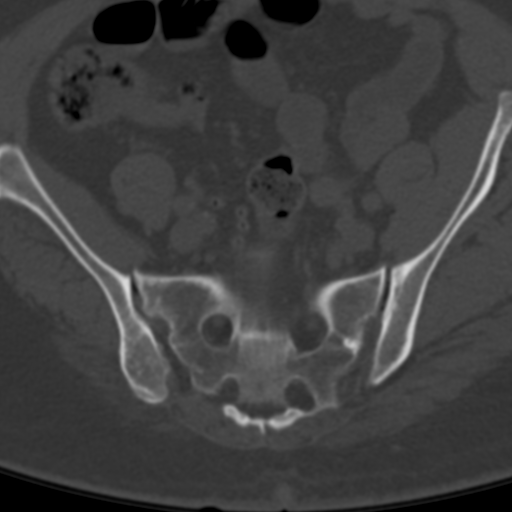
[im 67/124  bone]
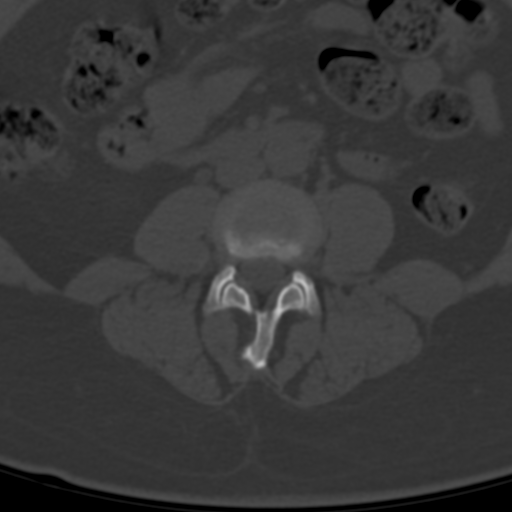
[im 105/124  bone]
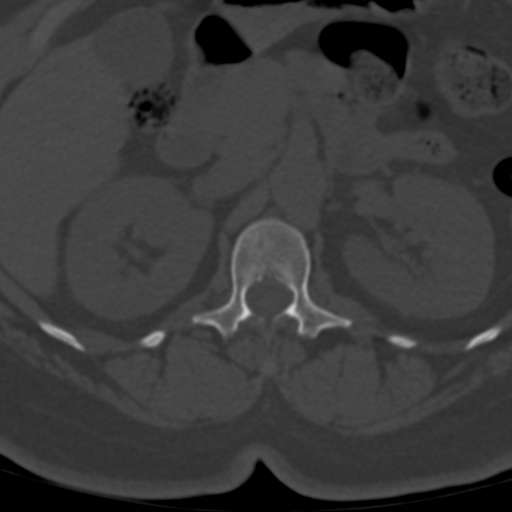

[Series 7: coronal bone · coronal · 0.23mm/px · 3 of 61 slices shown]
[im 13/61  bone]
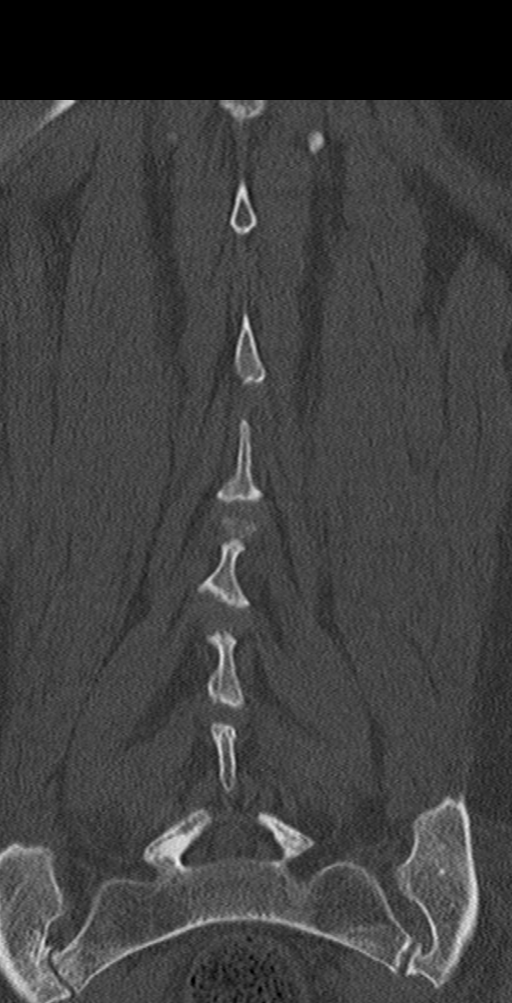
[im 25/61  bone]
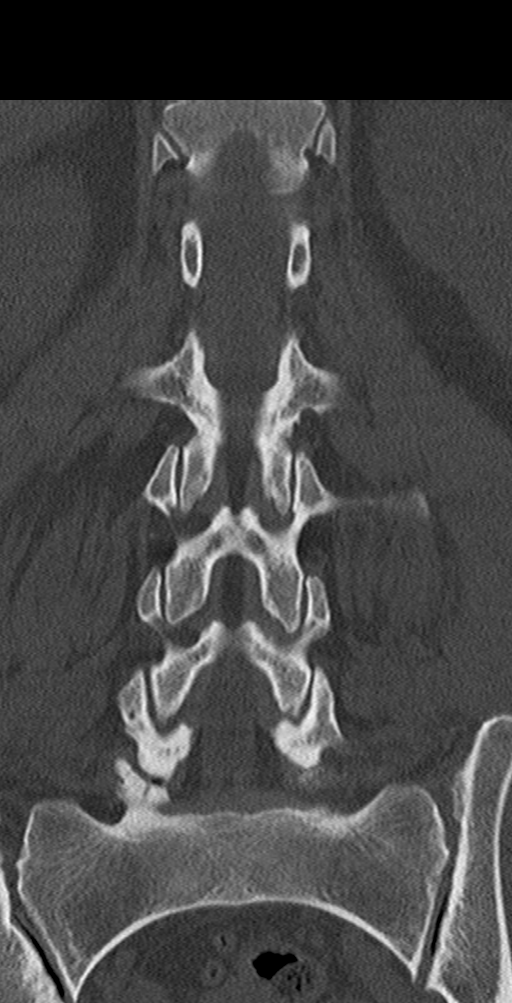
[im 37/61  bone]
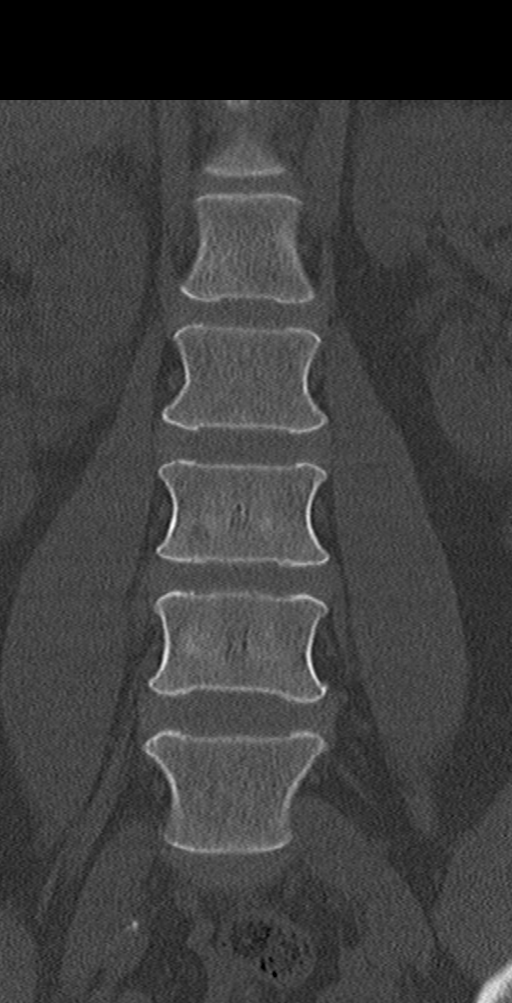

[Series 8: sagittal bone · sagittal · 0.31mm/px · 5 of 61 slices shown, 6 images]
[im 21/61  bone]
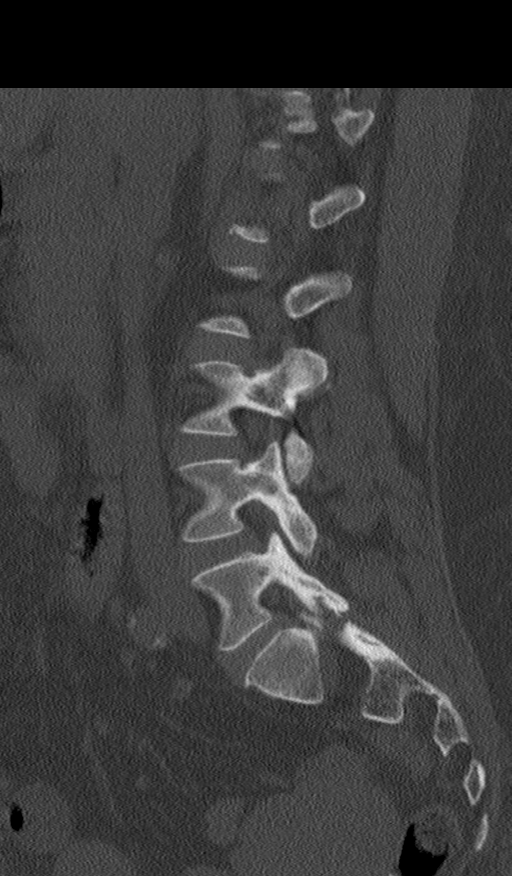
[im 26/61  bone]
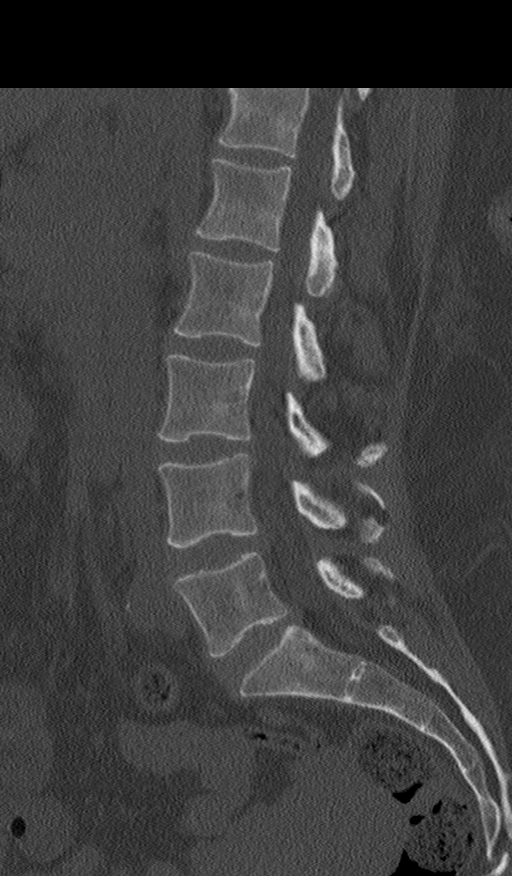
[im 31/61  soft-tissue]
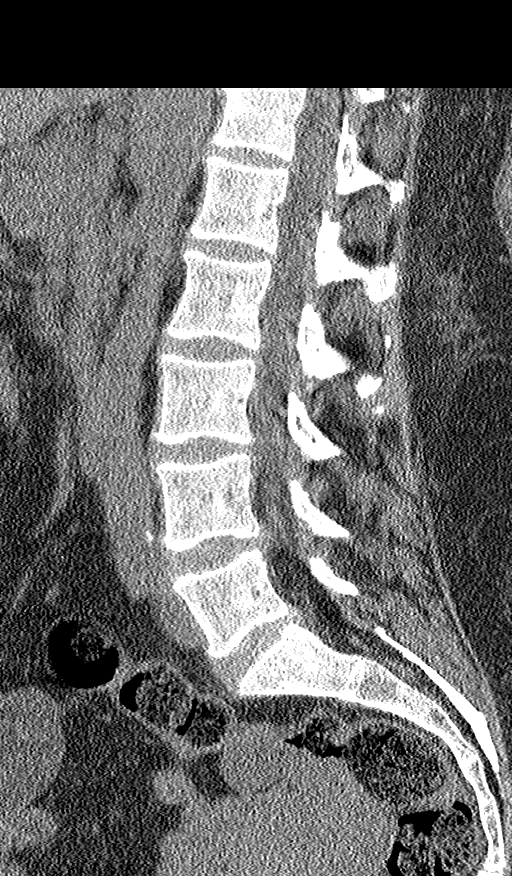
[im 31/61  bone]
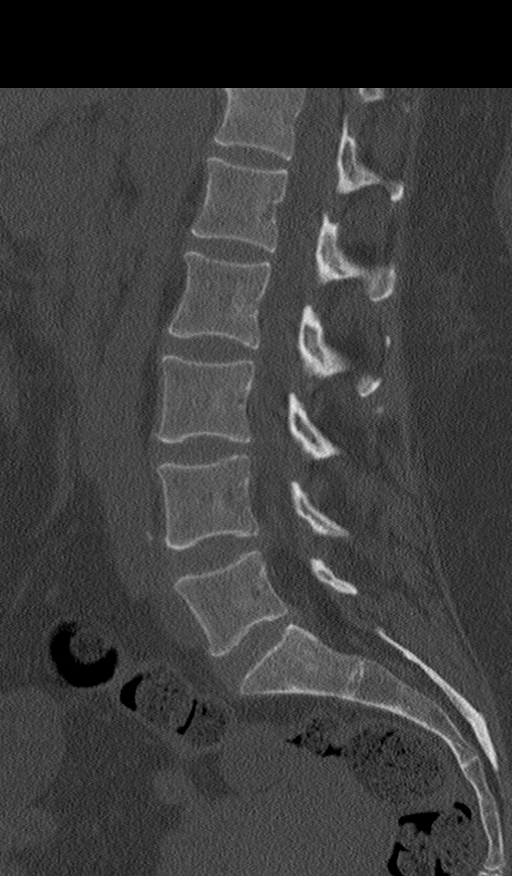
[im 36/61  bone]
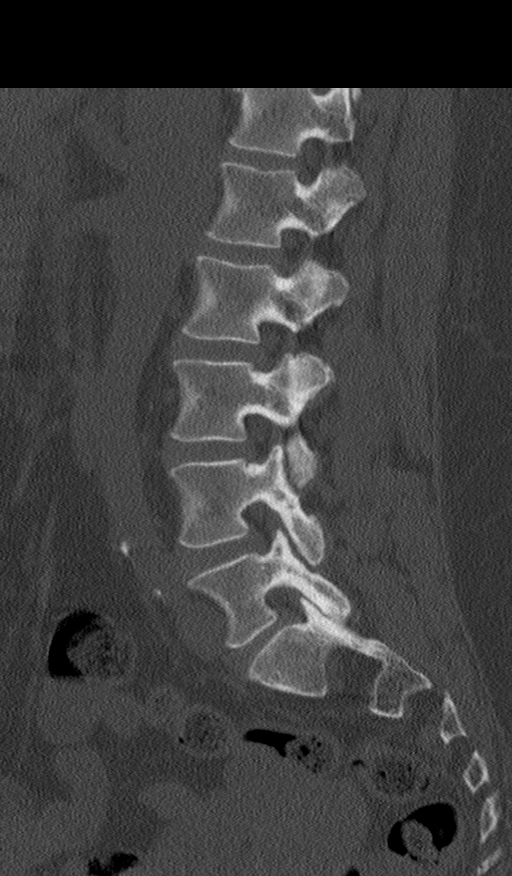
[im 41/61  bone]
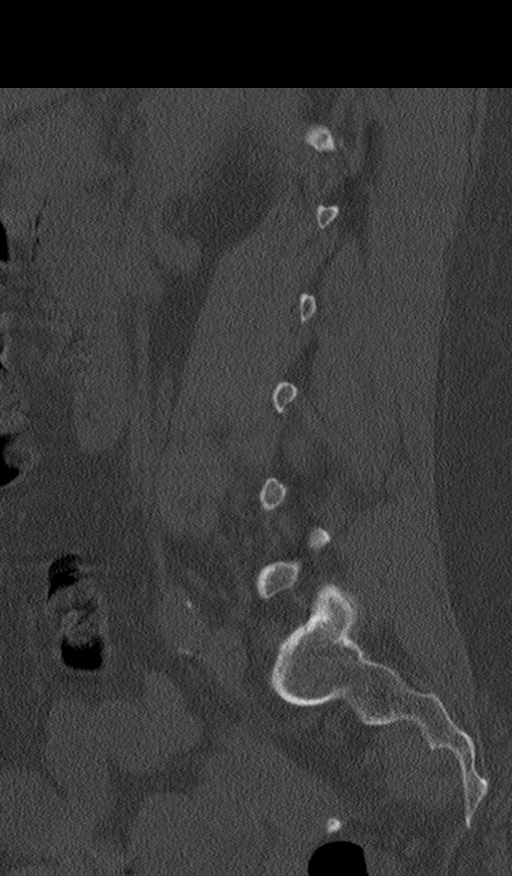

[11 of 33 positions shown; findings below may reference images not displayed]

FINDINGS: CT THORACIC SPINE FINDINGS

Alignment: Preserved.

Vertebrae: Vertebral body heights are maintained. No acute fracture.

Paraspinal and other soft tissues: No acute abnormality.

Disc levels: Intervertebral disc heights are maintained.

CT LUMBAR SPINE FINDINGS

Segmentation: 5 lumbar type vertebrae.

Alignment: Preserved.

Vertebrae: Vertebral body heights are maintained. No acute fracture.

Paraspinal and other soft tissues: No acute abnormality.

Disc levels: Intervertebral disc heights are maintained.
IMPRESSION: No acute fracture of the thoracolumbar spine.
